# Patient Record
Sex: Female | Born: 1990 | Race: Black or African American | Hispanic: No | Marital: Single | Smoking: Never smoker
Health system: Southern US, Community
[De-identification: ages and names within clinical notes are randomized; demographics above are authoritative.]

## PROBLEM LIST (undated history)

## (undated) ENCOUNTER — Inpatient Hospital Stay (HOSPITAL_COMMUNITY): Payer: Self-pay

## (undated) DIAGNOSIS — D649 Anemia, unspecified: Secondary | ICD-10-CM

## (undated) HISTORY — PX: OTHER SURGICAL HISTORY: SHX169

---

## 2011-12-24 ENCOUNTER — Encounter (HOSPITAL_COMMUNITY): Payer: Self-pay | Admitting: *Deleted

## 2011-12-24 DIAGNOSIS — R55 Syncope and collapse: Secondary | ICD-10-CM | POA: Insufficient documentation

## 2011-12-25 ENCOUNTER — Emergency Department (HOSPITAL_COMMUNITY)
Admission: EM | Admit: 2011-12-25 | Discharge: 2011-12-25 | Discharge status: Against Medical Advice | Payer: Managed Care, Other (non HMO) | Attending: Emergency Medicine | Admitting: Emergency Medicine

## 2011-12-25 HISTORY — DX: Anemia, unspecified: D64.9

## 2011-12-25 NOTE — ED Notes (Signed)
Pt decided to leave before being screen by MD

## 2013-11-24 ENCOUNTER — Ambulatory Visit (INDEPENDENT_AMBULATORY_CARE_PROVIDER_SITE_OTHER): Payer: BC Managed Care – PPO | Admitting: Internal Medicine

## 2013-11-24 ENCOUNTER — Ambulatory Visit: Payer: BC Managed Care – PPO

## 2013-11-24 VITALS — BP 128/80 | HR 94 | Temp 98.0°F | Resp 16 | Ht 64.0 in | Wt 126.0 lb

## 2013-11-24 DIAGNOSIS — R059 Cough, unspecified: Secondary | ICD-10-CM

## 2013-11-24 DIAGNOSIS — R079 Chest pain, unspecified: Secondary | ICD-10-CM

## 2013-11-24 DIAGNOSIS — J45901 Unspecified asthma with (acute) exacerbation: Secondary | ICD-10-CM

## 2013-11-24 DIAGNOSIS — R0602 Shortness of breath: Secondary | ICD-10-CM

## 2013-11-24 DIAGNOSIS — R05 Cough: Secondary | ICD-10-CM

## 2013-11-24 LAB — POCT CBC
Granulocyte percent: 54.5 %G (ref 37–80)
HCT, POC: 47.1 % (ref 37.7–47.9)
Hemoglobin: 14.6 g/dL (ref 12.2–16.2)
Lymph, poc: 1.2 (ref 0.6–3.4)
MCH, POC: 29.8 pg (ref 27–31.2)
MCHC: 31 g/dL — AB (ref 31.8–35.4)
MCV: 96.1 fL (ref 80–97)
MID (cbc): 0.3 (ref 0–0.9)
MPV: 8.9 fL (ref 0–99.8)
POC Granulocyte: 1.9 — AB (ref 2–6.9)
POC LYMPH PERCENT: 35.6 %L (ref 10–50)
POC MID %: 9.9 %M (ref 0–12)
Platelet Count, POC: 162 10*3/uL (ref 142–424)
RBC: 4.9 M/uL (ref 4.04–5.48)
RDW, POC: 13 %
WBC: 3.5 10*3/uL — AB (ref 4.6–10.2)

## 2013-11-24 MED ORDER — ALBUTEROL SULFATE (2.5 MG/3ML) 0.083% IN NEBU: 2.5000 mg | INHALATION_SOLUTION | Freq: Once | RESPIRATORY_TRACT | Status: AC

## 2013-11-24 MED ORDER — METHYLPREDNISOLONE SODIUM SUCC 125 MG IJ SOLR
125.0000 mg | Freq: Once | INTRAMUSCULAR | Status: AC
  Administered 2013-11-24: 125 mg via INTRAMUSCULAR

## 2013-11-24 MED ORDER — PREDNISONE 20 MG PO TABS: 60.0000 mg | ORAL_TABLET | Freq: Every day | ORAL | Status: AC

## 2013-11-24 NOTE — Progress Notes (Signed)
Subjective:    Patient ID: Renee Buck, female    DOB: October 13, 1991, [age] y.o.   MRN: [id number]  HPI 23 year old female presents for evaluation of acute onset of chest pain and shortness of breath this morning.    States she woke up feeling short of breath and complains of heaviness and pressure in her chest. Was diagnosed with influenza 4 days ago at another UC. Admits symptoms are improving and her fever broke last night. She was not treated with Tamiflu.   Today complains of slight nasal congestion and cough. Admits her cough has been productive but today is dry and hacking. Denies dizziness, nausea, vomiting, sore throat, fever, or chills.    Hx of asthma treated with albuterol prn and pulmicort. Admits her asthma has been mild since middle school and has not been hospitalized since then.    Has not been using either of her inhalers since she has been sick with this illness.      Review of Systems  Constitutional: Negative for fever and chills.  HENT: Positive for congestion, postnasal drip and rhinorrhea. Negative for ear pain and sore throat.   Respiratory: Positive for cough, chest tightness and shortness of breath.   Cardiovascular: Positive for chest pain.  Gastrointestinal: Negative for nausea and vomiting.  Neurological: Negative for dizziness and headaches.       Objective:   Physical Exam  Constitutional: She is oriented to person, place, and time. She appears well-developed and well-nourished.  HENT:  Head: Normocephalic and atraumatic.  Right Ear: External ear normal.  Left Ear: External ear normal.  Mouth/Throat: Oropharynx is clear and moist.  Eyes: Conjunctivae are normal.  Neck: Normal range of motion.  Cardiovascular: Normal rate, regular rhythm and normal heart sounds.   Pulmonary/Chest: Effort normal. She has decreased breath sounds. She has no wheezes. She has no rhonchi. She has no rales.  Breath sounds increased after breathing treatment.   Neurological:  She is alert and oriented to person, place, and time.  Psychiatric: She has a normal mood and affect. Her behavior is normal. Judgment and thought content normal.     Results for orders placed in visit on 11/24/13  POCT CBC      Result Value Range   WBC 3.5 (*) 4.6 - 10.2 K/uL   Lymph, poc 1.2  0.6 - 3.4   POC LYMPH PERCENT 35.6  10 - 50 %L   MID (cbc) 0.3  0 - 0.9   POC MID % 9.9  0 - 12 %M   POC Granulocyte 1.9 (*) 2 - 6.9   Granulocyte percent 54.5  37 - 80 %G   RBC 4.90  4.04 - 5.48 M/uL   Hemoglobin 14.6  12.2 - 16.2 g/dL   HCT, POC 40.947.1  81.137.7 - 47.9 %   MCV 96.1  80 - 97 fL   MCH, POC 29.8  27 - 31.2 pg   MCHC 31.0 (*) 31.8 - 35.4 g/dL   RDW, POC 91.413.0     Platelet Count, POC 162  142 - 424 K/uL   MPV 8.9  0 - 99.8 fL     Pre nebulizer peak flow = 200 Post albuterol/atrovent nebulizer = 400 [facility] reading (PRIMARY) by  Dr. Perrin MalteseGuest as hyperinflation. No acute consolidation or infiltrate noted. . ECG normal sinus rhythm.   Assessment & Plan:  Asthma with acute exacerbation - Plan: methylPREDNISolone sodium succinate (SOLU-MEDROL) 125 mg/2 mL injection 125 mg, predniSONE (DELTASONE) 20 MG tablet  Chest pain - Plan: DG Chest 2 View, EKG 12-Lead  Cough - Plan: DG Chest 2 View, POCT CBC  Shortness of breath - Plan: albuterol (PROVENTIL) (2.5 MG/3ML) 0.083% nebulizer solution 2.5 mg, methylPREDNISolone sodium succinate (SOLU-MEDROL) 125 mg/2 mL injection 125 mg  Solumedrol 125 mg IM today Start prednisone 60 mg daily tomorrow Pulmicort 2 puffs twice daily and albuterol q4-6hours prn SOB and cough RTC precautions discussed.  Return here or go to ER with any acutely worsening symptoms.

## 2014-01-02 ENCOUNTER — Ambulatory Visit (INDEPENDENT_AMBULATORY_CARE_PROVIDER_SITE_OTHER): Payer: BC Managed Care – PPO | Admitting: Internal Medicine

## 2014-01-02 VITALS — BP 110/72 | HR 85 | Temp 98.7°F | Resp 16 | Ht 65.0 in | Wt 135.4 lb

## 2014-01-02 DIAGNOSIS — N390 Urinary tract infection, site not specified: Secondary | ICD-10-CM

## 2014-01-02 DIAGNOSIS — R109 Unspecified abdominal pain: Secondary | ICD-10-CM

## 2014-01-02 LAB — POCT UA - MICROSCOPIC ONLY
Casts, Ur, LPF, POC: NEGATIVE
Crystals, Ur, HPF, POC: NEGATIVE
Yeast, UA: NEGATIVE

## 2014-01-02 LAB — POCT URINALYSIS DIPSTICK
Bilirubin, UA: NEGATIVE
Blood, UA: NEGATIVE
Glucose, UA: NEGATIVE
KETONES UA: NEGATIVE
LEUKOCYTES UA: NEGATIVE
Nitrite, UA: NEGATIVE
PH UA: 8.5
PROTEIN UA: NEGATIVE
SPEC GRAV UA: 1.02
Urobilinogen, UA: 0.2

## 2014-01-02 MED ORDER — CIPROFLOXACIN HCL 500 MG PO TABS: 500.0000 mg | ORAL_TABLET | Freq: Two times a day (BID) | ORAL | Status: AC

## 2014-01-02 NOTE — Patient Instructions (Signed)
Urinary Tract Infection  Urinary tract infections (UTIs) can develop anywhere along your urinary tract. Your urinary tract is your body's drainage system for removing wastes and extra water. Your urinary tract includes two kidneys, two ureters, a bladder, and a urethra. Your kidneys are a pair of bean-shaped organs. Each kidney is about the size of your fist. They are located below your ribs, one on each side of your spine.  CAUSES  Infections are caused by microbes, which are microscopic organisms, including fungi, viruses, and bacteria. These organisms are so small that they can only be seen through a microscope. Bacteria are the microbes that most commonly cause UTIs.  SYMPTOMS   Symptoms of UTIs may vary by age and gender of the patient and by the location of the infection. Symptoms in young women typically include a frequent and intense urge to urinate and a painful, burning feeling in the bladder or urethra during urination. Older women and men are more likely to be tired, shaky, and weak and have muscle aches and abdominal pain. A fever may mean the infection is in your kidneys. Other symptoms of a kidney infection include pain in your back or sides below the ribs, nausea, and vomiting.  DIAGNOSIS  To diagnose a UTI, your caregiver will ask you about your symptoms. Your caregiver also will ask to provide a urine sample. The urine sample will be tested for bacteria and white blood cells. White blood cells are made by your body to help fight infection.  TREATMENT   Typically, UTIs can be treated with medication. Because most UTIs are caused by a bacterial infection, they usually can be treated with the use of antibiotics. The choice of antibiotic and length of treatment depend on your symptoms and the type of bacteria causing your infection.  HOME CARE INSTRUCTIONS   If you were prescribed antibiotics, take them exactly as your caregiver instructs you. Finish the medication even if you feel better after you  have only taken some of the medication.   Drink enough water and fluids to keep your urine clear or pale yellow.   Avoid caffeine, tea, and carbonated beverages. They tend to irritate your bladder.   Empty your bladder often. Avoid holding urine for long periods of time.   Empty your bladder before and after sexual intercourse.   After a bowel movement, women should cleanse from front to back. Use each tissue only once.  SEEK MEDICAL CARE IF:    You have back pain.   You develop a fever.   Your symptoms do not begin to resolve within 3 days.  SEEK IMMEDIATE MEDICAL CARE IF:    You have severe back pain or lower abdominal pain.   You develop chills.   You have nausea or vomiting.   You have continued burning or discomfort with urination.  MAKE SURE YOU:    Understand these instructions.   Will watch your condition.   Will get help right away if you are not doing well or get worse.  Document Released: 07/22/2005 Document Revised: 04/12/2012 Document Reviewed: 11/20/2011  [employer] Patient Information 2014 [employer]

## 2014-01-02 NOTE — Progress Notes (Signed)
   Subjective:    Patient ID: Renee Buck, female    DOB: February 21, 1991, 23 y.o.   MRN: [id number]  HPI    Review of Systems     Objective:   Physical Exam        Assessment & Plan:

## 2014-01-02 NOTE — Progress Notes (Signed)
   Subjective:    Patient ID: Renee Buck, female    DOB: July 12, 1991, 23 y.o.   MRN: [id number]  HPI 23 year old female presents with left side pain for one day. The pain is in her left lower back and also anterior on the left. Pt states she thinks she has a kidney infection. She had a kidney infection back in November and it feels similar. No burning or increased frequency with urination. She had nausea 2 days ago, but no emesis. She has not taken any OTC medicines. Last time got much worse fast with fever, nausea and vomiting.   Review of Systems     Objective:   Physical Exam  Vitals reviewed. Constitutional: She is oriented to person, place, and time. She appears well-developed and well-nourished. No distress.  HENT:  Head: Normocephalic.  Eyes: EOM are normal. No scleral icterus.  Neck: Normal range of motion.  Pulmonary/Chest: Effort normal.  Abdominal: Soft. She exhibits no mass. There is no hepatosplenomegaly. There is tenderness in the left upper quadrant. There is CVA tenderness. There is no guarding.  Neurological: She is alert and oriented to person, place, and time. She exhibits normal muscle tone. Coordination normal.  Psychiatric: She has a normal mood and affect.    Results for orders placed in visit on 01/02/14  POCT URINALYSIS DIPSTICK      Result Value Ref Range   Color, UA yellow     Clarity, UA clear     Glucose, UA neg     Bilirubin, UA neg     Ketones, UA neg     Spec Grav, UA 1.020     Blood, UA neg     pH, UA 8.5     Protein, UA neg     Urobilinogen, UA 0.2     Nitrite, UA neg     Leukocytes, UA Negative     Results for orders placed in visit on 01/02/14  POCT URINALYSIS DIPSTICK      Result Value Ref Range   Color, UA yellow     Clarity, UA clear     Glucose, UA neg     Bilirubin, UA neg     Ketones, UA neg     Spec Grav, UA 1.020     Blood, UA neg     pH, UA 8.5     Protein, UA neg     Urobilinogen, UA 0.2     Nitrite, UA neg     Leukocytes, UA Negative    POCT UA - MICROSCOPIC ONLY      Result Value Ref Range   WBC, Ur, HPF, POC 0*-1     RBC, urine, microscopic 0-3     Bacteria, U Microscopic 1+     Mucus, UA trace     Epithelial cells, urine per micros 1-4     Crystals, Ur, HPF, POC neg     Casts, Ur, LPF, POC neg     Yeast, UA neg       Cs pending     Assessment & Plan:  Flank pain cause unclear/Hydrate, probable early uti Cipro 500mg  10d

## 2014-01-04 LAB — URINE CULTURE: Colony Count: 50000

## 2014-01-05 ENCOUNTER — Other Ambulatory Visit: Payer: Self-pay | Admitting: *Deleted

## 2014-01-05 ENCOUNTER — Encounter: Payer: Self-pay | Admitting: *Deleted

## 2014-01-05 MED ORDER — AMOXICILLIN 500 MG PO CAPS: 1000.0000 mg | ORAL_CAPSULE | Freq: Two times a day (BID) | ORAL | Status: AC

## 2014-02-07 ENCOUNTER — Ambulatory Visit: Payer: Managed Care, Other (non HMO) | Admitting: Physician Assistant

## 2015-07-04 IMAGING — CR DG CHEST 2V
2 series · 2 of 2 positions shown · non-contrast
Comparison: None.

CLINICAL DATA: Chest pain

EXAM:
CHEST  2 VIEW

[PA]
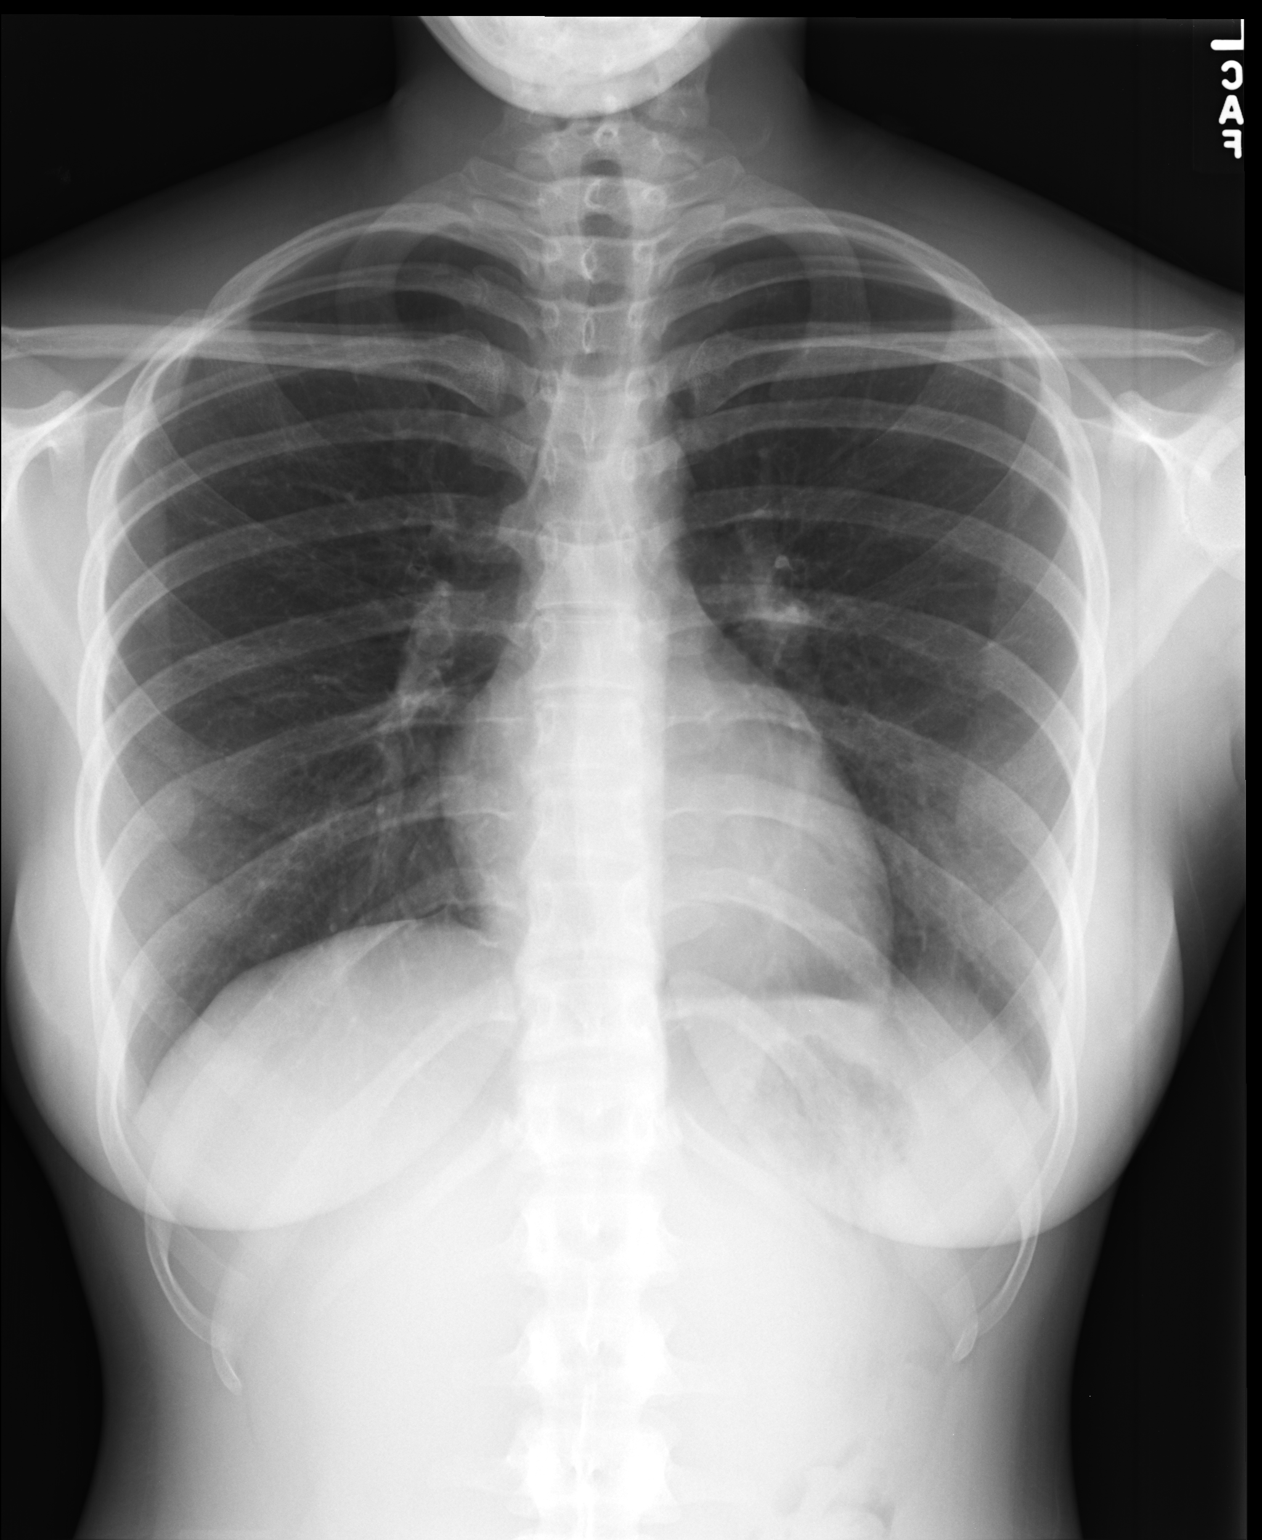

[lateral]
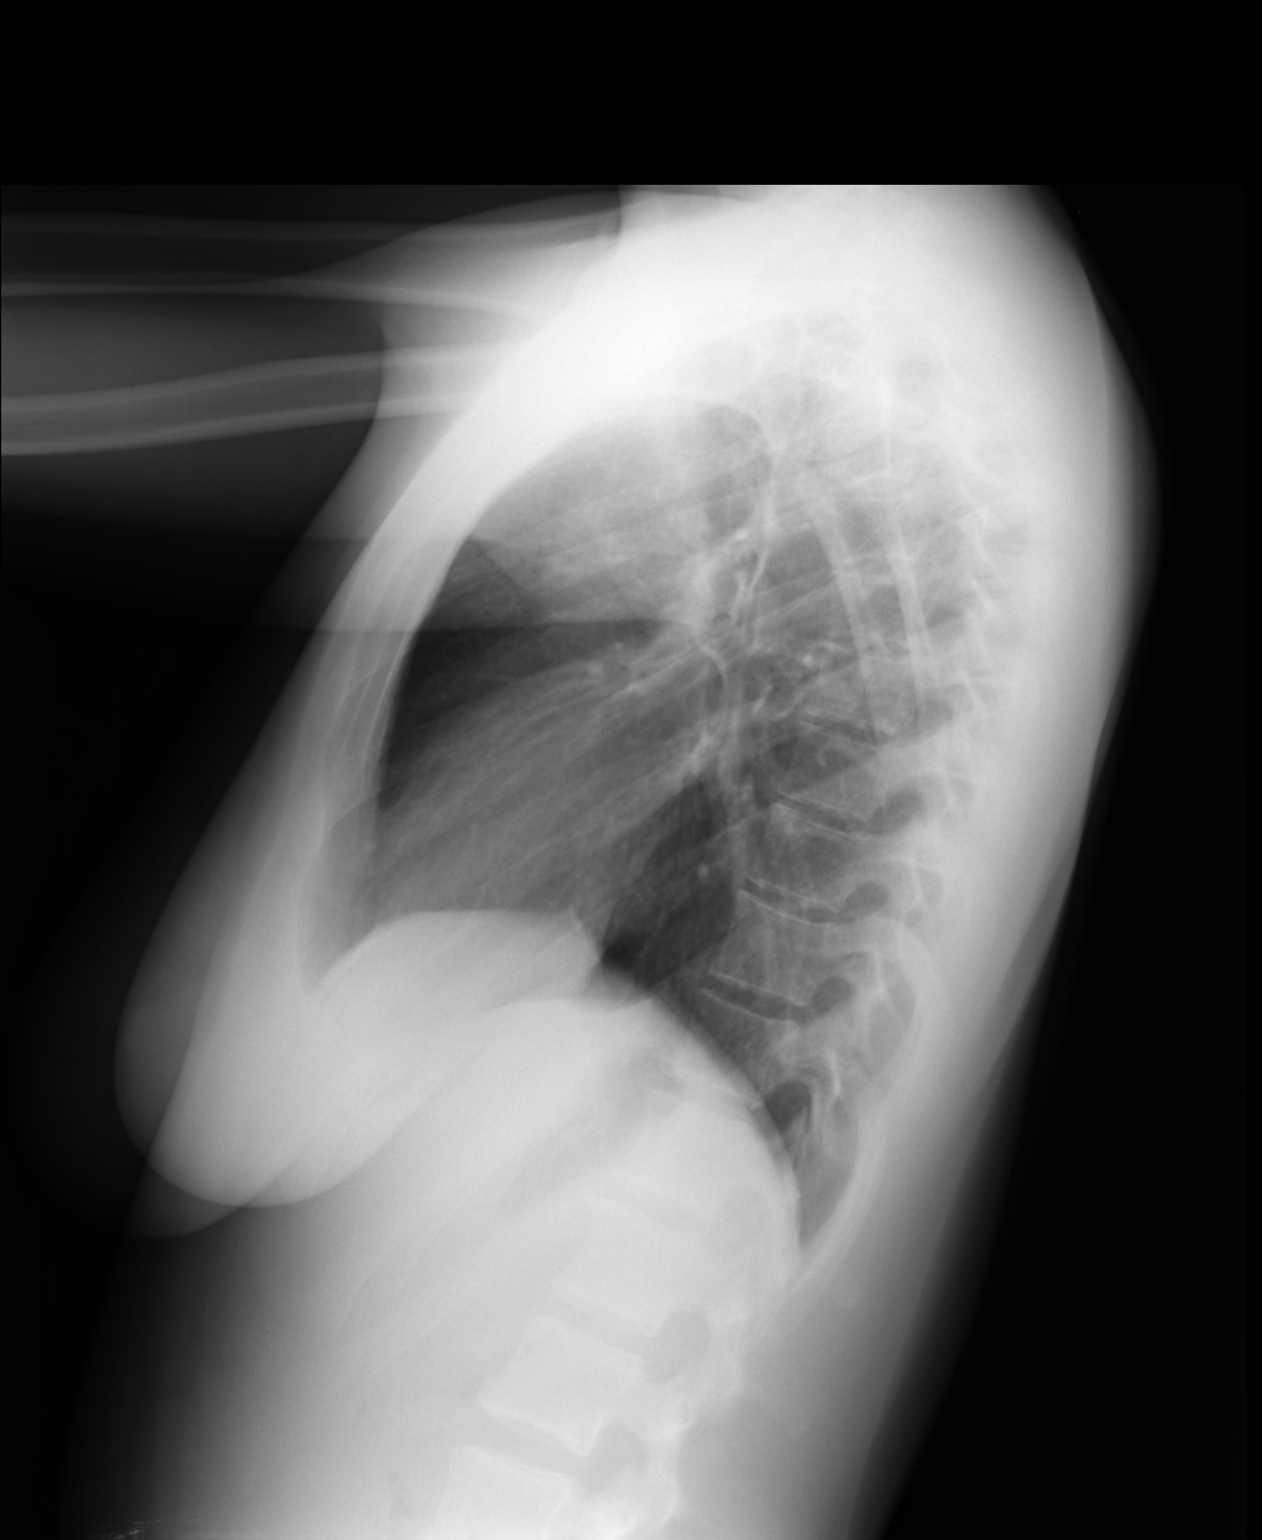

[2 of 2 positions shown; findings below may reference images not displayed]

FINDINGS: The heart size and mediastinal contours are within normal limits.
Both lungs are clear. The visualized skeletal structures are
unremarkable.
IMPRESSION: No active cardiopulmonary disease.

## 2015-11-07 ENCOUNTER — Inpatient Hospital Stay (HOSPITAL_COMMUNITY): Payer: 59

## 2015-11-07 ENCOUNTER — Encounter (HOSPITAL_COMMUNITY): Payer: Self-pay | Admitting: *Deleted

## 2015-11-07 ENCOUNTER — Inpatient Hospital Stay (HOSPITAL_COMMUNITY)
Admission: AD | Admit: 2015-11-07 | Discharge: 2015-11-08 | Disposition: A | Discharge status: Home / Self Care | Payer: 59 | Source: Ambulatory Visit | Attending: Obstetrics & Gynecology | Admitting: Obstetrics & Gynecology

## 2015-11-07 DIAGNOSIS — O3680X0 Pregnancy with inconclusive fetal viability, not applicable or unspecified: Secondary | ICD-10-CM

## 2015-11-07 DIAGNOSIS — O209 Hemorrhage in early pregnancy, unspecified: Secondary | ICD-10-CM

## 2015-11-07 DIAGNOSIS — Z3A11 11 weeks gestation of pregnancy: Secondary | ICD-10-CM | POA: Diagnosis not present

## 2015-11-07 DIAGNOSIS — O200 Threatened abortion: Secondary | ICD-10-CM

## 2015-11-07 LAB — CBC
HEMATOCRIT: 38.3 % (ref 36.0–46.0)
HEMOGLOBIN: 12.9 g/dL (ref 12.0–15.0)
MCH: 29.8 pg (ref 26.0–34.0)
MCHC: 33.7 g/dL (ref 30.0–36.0)
MCV: 88.5 fL (ref 78.0–100.0)
Platelets: 202 10*3/uL (ref 150–400)
RBC: 4.33 MIL/uL (ref 3.87–5.11)
RDW: 12.8 % (ref 11.5–15.5)
WBC: 8.8 10*3/uL (ref 4.0–10.5)

## 2015-11-07 LAB — URINALYSIS, ROUTINE W REFLEX MICROSCOPIC
Bilirubin Urine: NEGATIVE
GLUCOSE, UA: NEGATIVE mg/dL
Ketones, ur: 15 mg/dL — AB
LEUKOCYTES UA: NEGATIVE
Nitrite: NEGATIVE
PH: 6 (ref 5.0–8.0)
PROTEIN: NEGATIVE mg/dL
Specific Gravity, Urine: >1.03 — ABNORMAL HIGH (ref 1.005–1.030)

## 2015-11-07 LAB — URINE MICROSCOPIC-ADD ON

## 2015-11-07 LAB — WET PREP, GENITAL
CLUE CELLS WET PREP: NONE SEEN
SPERM: NONE SEEN
Trich, Wet Prep: NONE SEEN
YEAST WET PREP: NONE SEEN

## 2015-11-07 NOTE — MAU Note (Signed)
Notified provider that patient came in with c/o vaginal bleeding that started tonight at 2100. She had some spotting Wednesday. She was seen in the [facility] on 11/28  For pregnancy verification but has not been seen there since. She does not plan to be seen there again for prenatal care. Provider said that faculty practice could see patient.

## 2015-11-07 NOTE — MAU Provider Note (Signed)
History     CSN: [id number]  Arrival date and time: 11/07/15 2150   First Provider Initiated Contact with Patient 11/07/15 2235      Chief Complaint  Patient presents with  . Abdominal Cramping  . Vaginal Bleeding   HPI Comments: Renee Buck is a 25 y.o. G1P0 at [redacted]w[redacted]d who presents today with vaginal bleeding. She states that she was seen at [facility] on 09/23/[facility],and had a BHCG level drawn. She did not have any follow up from there. She states that she did not have pain or bleeding until about 5 days ago. She states that it has been brown, but is now red.   Vaginal Bleeding The patient's primary symptoms include pelvic pain. This is a new problem. The current episode started in the past 7 days. The problem occurs intermittently. The problem has been gradually worsening. The pain is mild (1/10). The problem affects both sides. She is pregnant. The vaginal bleeding is typical of menses. She has been passing clots (one clot about 1 inch long. ). She has not been passing tissue. Nothing aggravates the symptoms. She has tried nothing for the symptoms. The treatment provided no relief. Menstrual history: LMP 08/15/15     Past Medical History  Diagnosis Date  . Asthma     exercised induced  . Anemia     Past Surgical History  Procedure Laterality Date  . Wisdom tooth       History reviewed. No pertinent family history.  Social History  Substance Use Topics  . Smoking status: Never Smoker   . Smokeless tobacco: None  . Alcohol Use: No    Allergies: No Known Allergies  Facility-administered medications prior to admission  Medication Dose Route Frequency Provider Last Rate Last Dose  . albuterol (PROVENTIL) (2.5 MG/3ML) 0.083% nebulizer solution 2.5 mg  2.5 mg Nebulization Once Nelva Nay, PA-C       Prescriptions prior to admission  Medication Sig Dispense Refill Last Dose  . amoxicillin (AMOXIL) 500 MG capsule Take 2 capsules (1,000 mg total) by mouth 2 (two)  times daily. 20 capsule 0   . ciprofloxacin (CIPRO) 500 MG tablet Take 1 tablet (500 mg total) by mouth 2 (two) times daily. 20 tablet 0   . norethindrone-ethinyl estradiol (JUNEL FE,GILDESS FE,LOESTRIN FE) 1-20 MG-MCG tablet Take 1 tablet by mouth daily.   Taking  . predniSONE (DELTASONE) 20 MG tablet Take 3 tablets (60 mg total) by mouth daily with breakfast. 15 tablet 0 Not Taking    Review of Systems  Genitourinary: Positive for vaginal bleeding and pelvic pain.   Physical Exam   Blood pressure 131/73, pulse 81, temperature 98.1 F (36.7 C), temperature source Oral, resp. rate 20, height 5\' 5"  (1.651 m), weight 69.06 kg (152 lb 4 oz), last menstrual period 08/15/2015.  Physical Exam  Nursing note and vitals reviewed. Constitutional: She is oriented to person, place, and time. She appears well-developed and well-nourished. No distress.  HENT:  Head: Normocephalic.  Cardiovascular: Normal rate.   Respiratory: Effort normal.  GI: Soft. There is no tenderness. There is no rebound.  Genitourinary:   External: no lesion Vagina: small amount of blood seen  Cervix: pink, smooth, no CMT Uterus: NSSC  Adnexa: NT   Neurological: She is alert and oriented to person, place, and time.  Skin: Skin is warm and dry.  Psychiatric: She has a normal mood and affect.   Results for orders placed or performed during the hospital encounter of 11/07/15 (from  the past 24 hour(s))  Urinalysis, Routine w reflex microscopic ([facility])     Status: Abnormal   Collection Time: 11/07/15 10:07 PM  Result Value Ref Range   Color, Urine YELLOW YELLOW   APPearance CLEAR CLEAR   Specific Gravity, Urine >1.030 (H) 1.005 - 1.030   pH 6.0 5.0 - 8.0   Glucose, UA NEGATIVE NEGATIVE mg/dL   Hgb urine dipstick LARGE (A) NEGATIVE   Bilirubin Urine NEGATIVE NEGATIVE   Ketones, ur 15 (A) NEGATIVE mg/dL   Protein, ur NEGATIVE NEGATIVE mg/dL   Nitrite NEGATIVE NEGATIVE   Leukocytes, UA NEGATIVE NEGATIVE  Urine  microscopic-add on     Status: Abnormal   Collection Time: 11/07/15 10:07 PM  Result Value Ref Range   Squamous Epithelial / LPF 0-5 (A) NONE SEEN   WBC, UA 0-5 0 - 5 WBC/hpf   RBC / HPF 6-30 0 - 5 RBC/hpf   Bacteria, UA RARE (A) NONE SEEN  Wet prep, genital     Status: Abnormal   Collection Time: 11/07/15 10:53 PM  Result Value Ref Range   Yeast Wet Prep HPF POC NONE SEEN NONE SEEN   Trich, Wet Prep NONE SEEN NONE SEEN   Clue Cells Wet Prep HPF POC NONE SEEN NONE SEEN   WBC, Wet Prep HPF POC FEW (A) NONE SEEN   Sperm NONE SEEN   CBC     Status: None   Collection Time: 11/07/15 11:05 PM  Result Value Ref Range   WBC 8.8 4.0 - 10.5 K/uL   RBC 4.33 3.87 - 5.11 MIL/uL   Hemoglobin 12.9 12.0 - 15.0 g/dL   HCT 16.138.3 09.636.0 - 04.546.0 %   MCV 88.5 78.0 - 100.0 fL   MCH 29.8 26.0 - 34.0 pg   MCHC 33.7 30.0 - 36.0 g/dL   RDW 40.912.8 81.111.5 - 91.415.5 %   Platelets 202 150 - 400 K/uL  ABO/Rh     Status: None (Preliminary result)   Collection Time: 11/07/15 11:05 PM  Result Value Ref Range   ABO/RH(D) A POS   hCG, quantitative, pregnancy     Status: Abnormal   Collection Time: 11/07/15 11:05 PM  Result Value Ref Range   hCG, Beta Chain, Quant, S 9956 (H) <5 mIU/mL   [place] Ob Comp Less 14 Wks  11/08/2015  CLINICAL DATA:  Vaginal bleeding for 1.5 hours.  Pelvic cramping. EXAM: OBSTETRIC <14 WK ULTRASOUND TECHNIQUE: Transabdominal ultrasound was performed for evaluation of the gestation as well as the maternal uterus and adnexal regions. Patient declined transvaginal evaluation. COMPARISON:  None. FINDINGS: Intrauterine gestational sac: Probable intrauterine gestational sac. Shape is slightly irregular. Yolk sac:  Not present. Embryo:  Not present. MSD: 15.1  mm   6 w   2  d                US EDC: 06/30/2016 Subchorionic hemorrhage: None visualized on transabdominal evaluation. Maternal uterus/adnexae: Both ovaries are identified and are normal. No pelvic free fluid is seen. IMPRESSION: Probable early  intrauterine gestational sac, but no yolk sac, fetal pole, or cardiac activity yet visualized. Recommend follow-up quantitative B-HCG levels and follow-up US in 14 days to confirm and assess viability. This recommendation follows SRU consensus guidelines: Diagnostic Criteria for Nonviable Pregnancy Early in the First Trimester. Malva Limes Engl J Med 2013; 782:9562-13; 369:1443-51. Please no patient declined transvaginal evaluation, partially limiting assessment. Electronically Signed   By: Rubye OaksMelanie  Ehinger M.D.   On: 11/08/2015 00:08    MAU  Course  Procedures  MDM   Assessment and Plan   1. Threatened abortion in early pregnancy   2. Vaginal bleeding in pregnancy, first trimester   3. Pregnancy of unknown anatomic location    DC home Comfort measures reviewed  1st Trimester precautions  Bleeding precautions Ectopic precautions RX: none  Return to MAU as needed FU with OB as planned  Follow-up Information    Follow up with [facility] [place] [facility].   Why:  Saturday night or Sunday morning for blood work.    Contact information:   [address] [id number] mc [place] [address] [phone]        Tawnya Crook 11/08/2015, 12:10 AM

## 2015-11-07 NOTE — MAU Note (Signed)
PT  SAYS SHE HAS VAG  BLEEDING  THAT  STARTED    AT 9PM   WHILE  SITTING.       HAD   SPOTTING  ON   WED-  AND  HAD  PAD  ON -   HAD  A  GUSH  RED  BLOOD.            PAD ON IN TRIAGE-        SMALL AMT  LIGHT  RED.      STARTED  HAVING    CRAMPS  AT  9PM.        PREG  WAS  CONFIRMED  AT  [facility]  11-28.Marland Kitchen.    WANTS PNC IN [place]- .         LAST SEX-    WED.

## 2015-11-08 DIAGNOSIS — O200 Threatened abortion: Secondary | ICD-10-CM

## 2015-11-08 LAB — ABO/RH: ABO/RH(D): A POS

## 2015-11-08 LAB — HCG, QUANTITATIVE, PREGNANCY: hCG, Beta Chain, Quant, S: 9956 m[IU]/mL — ABNORMAL HIGH (ref <5)

## 2015-11-08 LAB — HIV ANTIBODY (ROUTINE TESTING W REFLEX): HIV SCREEN 4TH GENERATION: NONREACTIVE

## 2015-11-08 LAB — GC/CHLAMYDIA PROBE AMP (~~LOC~~) NOT AT ARMC
CHLAMYDIA, DNA PROBE: NEGATIVE
Neisseria Gonorrhea: NEGATIVE

## 2015-11-08 LAB — RPR: RPR: NONREACTIVE

## 2015-11-08 NOTE — Discharge Instructions (Signed)
Threatened Miscarriage °A threatened miscarriage occurs when you have vaginal bleeding during your first 20 weeks of pregnancy but the pregnancy has not ended. If you have vaginal bleeding during this time, your health care provider will do tests to make sure you are still pregnant. If the tests show you are still pregnant and the developing baby (fetus) inside your womb (uterus) is still growing, your condition is considered a threatened miscarriage. °A threatened miscarriage does not mean your pregnancy will end, but it does increase the risk of losing your pregnancy (complete miscarriage). °CAUSES  °The cause of a threatened miscarriage is usually not known. If you go on to have a complete miscarriage, the most common cause is an abnormal number of chromosomes in the developing baby. Chromosomes are the structures inside cells that hold all your genetic material. °Some causes of vaginal bleeding that do not result in miscarriage include: °· Having sex. °· Having an infection. °· Normal hormone changes of pregnancy. °· Bleeding that occurs when an egg implants in your uterus. °RISK FACTORS °Risk factors for bleeding in early pregnancy include: °· Obesity. °· Smoking. °· Drinking excessive amounts of alcohol or caffeine. °· Recreational drug use. °SIGNS AND SYMPTOMS °· Light vaginal bleeding. °· Mild abdominal pain or cramps. °DIAGNOSIS  °If you have bleeding with or without abdominal pain before 20 weeks of pregnancy, your health care provider will do tests to check whether you are still pregnant. One important test involves using sound waves and a computer (ultrasound) to create images of the inside of your uterus. Other tests include an internal exam of your vagina and uterus (pelvic exam) and measurement of your baby's heart rate.  °You may be diagnosed with a threatened miscarriage if: °· Ultrasound testing shows you are still pregnant. °· Your baby's heart rate is strong. °· A pelvic exam shows that the  opening between your uterus and your vagina (cervix) is closed. °· Your heart rate and blood pressure are stable. °· Blood tests confirm you are still pregnant. °TREATMENT  °No treatments have been shown to prevent a threatened miscarriage from going on to a complete miscarriage. However, the right home care is important.  °HOME CARE INSTRUCTIONS  °· Make sure you keep all your appointments for prenatal care. This is very important. °· Get plenty of rest. °· Do not have sex or use tampons if you have vaginal bleeding. °· Do not douche. °· Do not smoke or use recreational drugs. °· Do not drink alcohol. °· Avoid caffeine. °SEEK MEDICAL CARE IF: °· You have light vaginal bleeding or spotting while pregnant. °· You have abdominal pain or cramping. °· You have a fever. °SEEK IMMEDIATE MEDICAL CARE IF: °· You have heavy vaginal bleeding. °· You have blood clots coming from your vagina. °· You have severe low back pain or abdominal cramps. °· You have fever, chills, and severe abdominal pain. °MAKE SURE YOU: °· Understand these instructions. °· Will watch your condition. °· Will get help right away if you are not doing well or get worse. °  °This information is not intended to replace advice given to you by your health care provider. Make sure you discuss any questions you have with your health care provider. °  °Document Released: 10/12/2005 Document Revised: 10/17/2013 Document Reviewed: 08/08/2013 °Elsevier Interactive Patient Education ©2016 [employer] °Ectopic Pregnancy °An ectopic pregnancy is when the fertilized egg attaches (implants) outside the uterus. Most ectopic pregnancies occur in the fallopian tube. Rarely do ectopic pregnancies occur   on the ovary, intestine, pelvis, or cervix. In an ectopic pregnancy, the fertilized egg does not have the ability to develop into a normal, healthy baby.  °A ruptured ectopic pregnancy is one in which the fallopian tube gets torn or bursts and results in internal bleeding.  Often there is intense abdominal pain, and sometimes, vaginal bleeding. Having an ectopic pregnancy can be life threatening. If left untreated, this dangerous condition can lead to a blood transfusion, abdominal surgery, or even death. °CAUSES  °Damage to the fallopian tubes is the suspected cause in most ectopic pregnancies.  °RISK FACTORS °Depending on your circumstances, the risk of having an ectopic pregnancy will vary. The level of risk can be divided into three categories. °High Risk °· You have gone through infertility treatment. °· You have had a previous ectopic pregnancy. °· You have had previous tubal surgery. °· You have had previous surgery to have the fallopian tubes tied (tubal ligation). °· You have tubal problems or diseases. °· You have been exposed to DES. DES is a medicine that was used until 1971 and had effects on babies whose mothers took the medicine. °· You become pregnant while using an intrauterine device (IUD) for birth control.  °Moderate Risk °· You have a history of infertility. °· You have a history of a sexually transmitted infection (STI). °· You have a history of pelvic inflammatory disease (PID). °· You have scarring from endometriosis. °· You have multiple sexual partners. °· You smoke.  °Low Risk °· You have had previous pelvic surgery. °· You use vaginal douching. °· You became sexually active before 24 years of age. °SIGNS AND SYMPTOMS  °An ectopic pregnancy should be suspected in anyone who has missed a period and has abdominal pain or bleeding. °· You may experience normal pregnancy symptoms, such as: °· Nausea. °· Tiredness. °· Breast tenderness. °· Other symptoms may include: °· Pain with intercourse. °· Irregular vaginal bleeding or spotting. °· Cramping or pain on one side or in the lower abdomen. °· Fast heartbeat. °· Passing out while having a bowel movement. °· Symptoms of a ruptured ectopic pregnancy and internal bleeding may include: °· Sudden, severe pain in the  abdomen and pelvis. °· Dizziness or fainting. °· Pain in the shoulder area. °DIAGNOSIS  °Tests that may be performed include: °· A pregnancy test. °· An ultrasound test. °· Testing the specific level of pregnancy hormone in the bloodstream. °· Taking a sample of uterus tissue (dilation and curettage, D&C). °· Surgery to perform a visual exam of the inside of the abdomen using a thin, lighted tube with a tiny camera on the end (laparoscope). °TREATMENT  °An injection of a medicine called methotrexate may be given. This medicine causes the pregnancy tissue to be absorbed. It is given if: °· The diagnosis is made early. °· The fallopian tube has not ruptured. °· You are considered to be a good candidate for the medicine. °Usually, pregnancy hormone blood levels are checked after methotrexate treatment. This is to be sure the medicine is effective. It may take 4-6 weeks for the pregnancy to be absorbed (though most pregnancies will be absorbed by 3 weeks). °Surgical treatment may be needed. A laparoscope may be used to remove the pregnancy tissue. If severe internal bleeding occurs, a cut (incision) may be made in the lower abdomen (laparotomy), and the ectopic pregnancy is removed. This stops the bleeding. Part of the fallopian tube, or the whole tube, may be removed as well (salpingectomy). After surgery, pregnancy hormone tests   may be done to be sure there is no pregnancy tissue left. You may receive a Rho (D) immune globulin shot if you are Rh negative and the father is Rh positive, or if you do not know the Rh type of the father. This is to prevent problems with any future pregnancy. °SEEK IMMEDIATE MEDICAL CARE IF:  °You have any symptoms of an ectopic pregnancy. This is a medical emergency. °MAKE SURE YOU: °· Understand these instructions. °· Will watch your condition. °· Will get help right away if you are not doing well or get worse. °  °This information is not intended to replace advice given to you by your  health care provider. Make sure you discuss any questions you have with your health care provider. °  °Document Released: 11/19/2004 Document Revised: 11/02/2014 Document Reviewed: 05/11/2013 °Elsevier Interactive Patient Education ©2016 [employer] ° °

## 2015-11-10 ENCOUNTER — Inpatient Hospital Stay (HOSPITAL_COMMUNITY)
Admission: AD | Admit: 2015-11-10 | Discharge: 2015-11-10 | Disposition: A | Discharge status: Home / Self Care | Payer: 59 | Source: Ambulatory Visit | Attending: Obstetrics & Gynecology | Admitting: Obstetrics & Gynecology

## 2015-11-10 DIAGNOSIS — Z3A12 12 weeks gestation of pregnancy: Secondary | ICD-10-CM | POA: Insufficient documentation

## 2015-11-10 DIAGNOSIS — O209 Hemorrhage in early pregnancy, unspecified: Secondary | ICD-10-CM | POA: Diagnosis present

## 2015-11-10 DIAGNOSIS — O039 Complete or unspecified spontaneous abortion without complication: Secondary | ICD-10-CM | POA: Insufficient documentation

## 2015-11-10 LAB — HCG, QUANTITATIVE, PREGNANCY: HCG, BETA CHAIN, QUANT, S: 1188 m[IU]/mL — AB (ref <5)

## 2015-11-10 NOTE — MAU Provider Note (Signed)
S: 25 y.o. G1P0 @[redacted]w[redacted]d  by LMP presents to MAU for repeat hcg.  She presented 1/12 for vaginal bleeding and abdominal pain.  She reports vaginal bleeding like menses continues today.  She is changing a pad every 3-4 hours and reports small clots with bleeding.  She reports mild cramping associated with the bleeding. She has not needed anything, including medications, for her pain.  She denies dizziness, h/a, or n/v.     Her quant hcg on 1/12 was 9956 and ultrasound showed gestational sac, no yolk sac or fetal pole.  HPI  O: BP 131/67 mmHg  Pulse 93  Temp(Src) 98.7 F (37.1 C) (Oral)  Resp 16  LMP 08/15/2015  VS reviewed, nursing note reviewed,  Constitutional: well developed, well nourished, no distress HEENT: normocephalic CV: normal rate Pulm/chest wall: normal effort Abdomen: soft Neuro: alert and oriented x 3 Skin: warm, dry Psych: affect normal  Results for orders placed or performed during the hospital encounter of 11/10/15 (from the past 24 hour(s))  hCG, quantitative, pregnancy     Status: Abnormal   Collection Time: 11/10/15 10:44 AM  Result Value Ref Range   hCG, Beta Chain, Quant, S 1188 (H) <5 mIU/mL    --/--/A POS (01/12 2305)  MDM: Ordered labs/reviewed results.  Quant hcg dropped significantly.  Discussed results with pt.  Pt to f/u in [facility] in 1 week.  Bleeding precautions given. Pt stable at time of discharge.  A: 1. SAB (spontaneous abortion)     P: D/C home with bleeding precautions F/U in [facility] in 1 week, message sent to set up appointment Return to MAU as needed for emergencies  LEFTWICH-KIRBY, Tammera Engert, CNM 2:18 PM

## 2015-11-10 NOTE — MAU Note (Signed)
Patient presents with still having vaginal bleeding changing  Pad every 2 hours or so, cramping, 4/10.

## 2015-11-10 NOTE — Discharge Instructions (Signed)
Miscarriage  A miscarriage is the sudden loss of an unborn baby (fetus) before the 20th week of pregnancy. Most miscarriages happen in the first 3 months of pregnancy. Sometimes, it happens before a woman even knows she is pregnant. A miscarriage is also called a "spontaneous miscarriage" or "early pregnancy loss." Having a miscarriage can be an emotional experience. Talk with your caregiver about any questions you may have about miscarrying, the grieving process, and your future pregnancy plans.  CAUSES    Problems with the fetal chromosomes that make it impossible for the baby to develop normally. Problems with the baby's genes or chromosomes are most often the result of errors that occur, by chance, as the embryo divides and grows. The problems are not inherited from the parents.   Infection of the cervix or uterus.    Hormone problems.    Problems with the cervix, such as having an incompetent cervix. This is when the tissue in the cervix is not strong enough to hold the pregnancy.    Problems with the uterus, such as an abnormally shaped uterus, uterine fibroids, or congenital abnormalities.    Certain medical conditions.    Smoking, drinking alcohol, or taking illegal drugs.    Trauma.   Often, the cause of a miscarriage is unknown.   SYMPTOMS    Vaginal bleeding or spotting, with or without cramps or pain.   Pain or cramping in the abdomen or lower back.   Passing fluid, tissue, or blood clots from the vagina.  DIAGNOSIS   Your caregiver will perform a physical exam. You may also have an ultrasound to confirm the miscarriage. Blood or urine tests may also be ordered.  TREATMENT    Sometimes, treatment is not necessary if you naturally pass all the fetal tissue that was in the uterus. If some of the fetus or placenta remains in the body (incomplete miscarriage), tissue left behind may become infected and must be removed. Usually, a dilation and curettage (D and C) procedure is performed.  During a D and C procedure, the cervix is widened (dilated) and any remaining fetal or placental tissue is gently removed from the uterus.   Antibiotic medicines are prescribed if there is an infection. Other medicines may be given to reduce the size of the uterus (contract) if there is a lot of bleeding.   If you have Rh negative blood and your baby was Rh positive, you will need a Rh immunoglobulin shot. This shot will protect any future baby from having Rh blood problems in future pregnancies.  HOME CARE INSTRUCTIONS    Your caregiver may order bed rest or may allow you to continue light activity. Resume activity as directed by your caregiver.   Have someone help with home and family responsibilities during this time.    Keep track of the number of sanitary pads you use each day and how soaked (saturated) they are. Write down this information.    Do not use tampons. Do not douche or have sexual intercourse until approved by your caregiver.    Only take over-the-counter or prescription medicines for pain or discomfort as directed by your caregiver.    Do not take aspirin. Aspirin can cause bleeding.    Keep all follow-up appointments with your caregiver.    If you or your partner have problems with grieving, talk to your caregiver or seek counseling to help cope with the pregnancy loss. Allow enough time to grieve before trying to get pregnant again.     SEEK IMMEDIATE MEDICAL CARE IF:    You have severe cramps or pain in your back or abdomen.   You have a fever.   You pass large blood clots (walnut-sized or larger) ortissue from your vagina. Save any tissue for your caregiver to inspect.    Your bleeding increases.    You have a thick, bad-smelling vaginal discharge.   You become lightheaded, weak, or you faint.    You have chills.   MAKE SURE YOU:   Understand these instructions.   Will watch your condition.   Will get help right away if you are not doing well or get worse.     This  information is not intended to replace advice given to you by your health care provider. Make sure you discuss any questions you have with your health care provider.     Document Released: 04/07/2001 Document Revised: 02/06/2013 Document Reviewed: 12/01/2011  Elsevier Interactive Patient Education 2016 [employer] Inc.

## 2015-11-25 ENCOUNTER — Ambulatory Visit: Payer: 59 | Admitting: Obstetrics and Gynecology

## 2015-11-28 ENCOUNTER — Ambulatory Visit: Payer: 59 | Admitting: Obstetrics and Gynecology

## 2016-09-11 ENCOUNTER — Encounter (HOSPITAL_COMMUNITY): Payer: Self-pay

## 2017-06-16 IMAGING — US US OB COMP LESS 14 WK
1 series · 15 of 27 positions shown · non-contrast
Comparison: None.

CLINICAL DATA: Vaginal bleeding for 1.5 hours.  Pelvic cramping.

EXAM:
OBSTETRIC <14 WK ULTRASOUND
TECHNIQUE: Transabdominal ultrasound was performed for evaluation of the
gestation as well as the maternal uterus and adnexal regions.
Patient declined transvaginal evaluation.

[Series 1: us ob comp less 14 wk · 27 acquisitions, 15 frames shown]
[im 1/27]
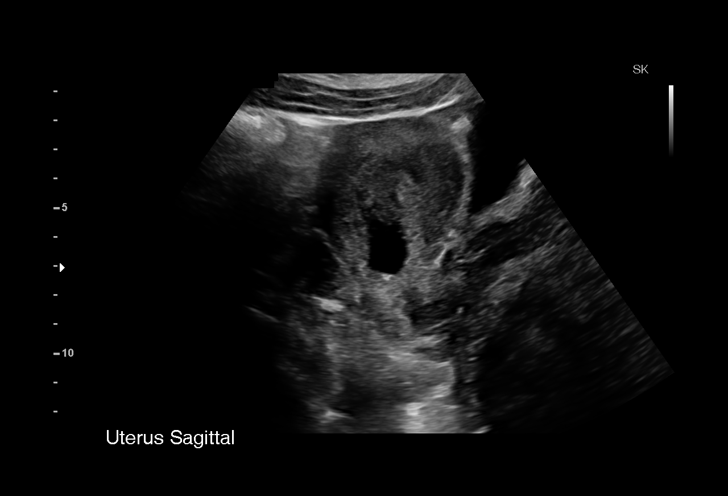
[im 3/27]
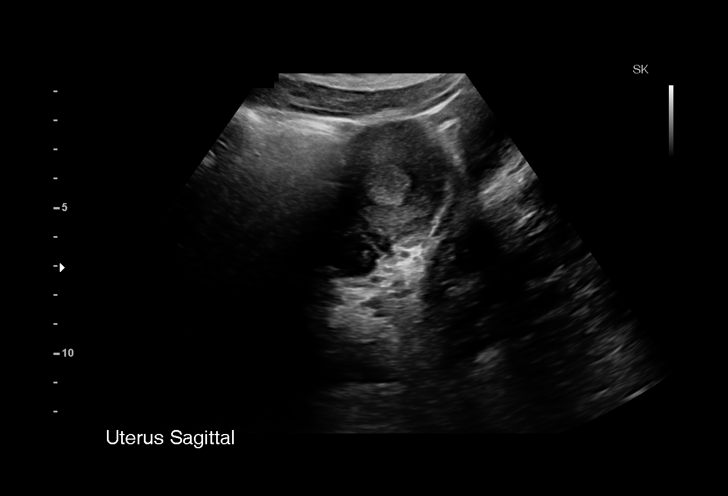
[im 5/27]
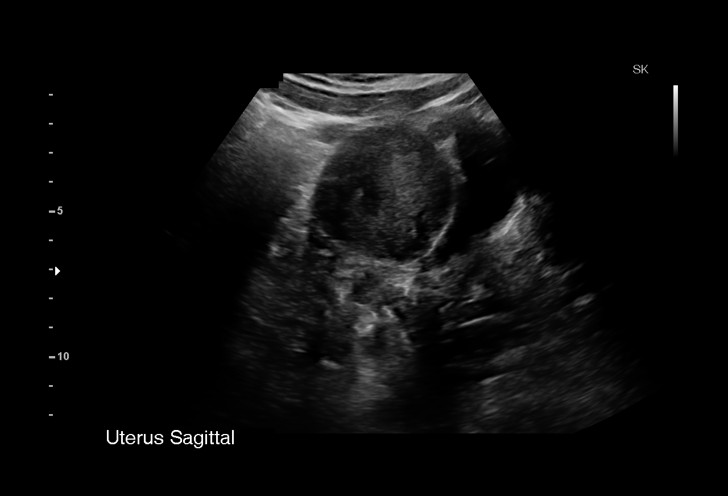
[im 7/27]
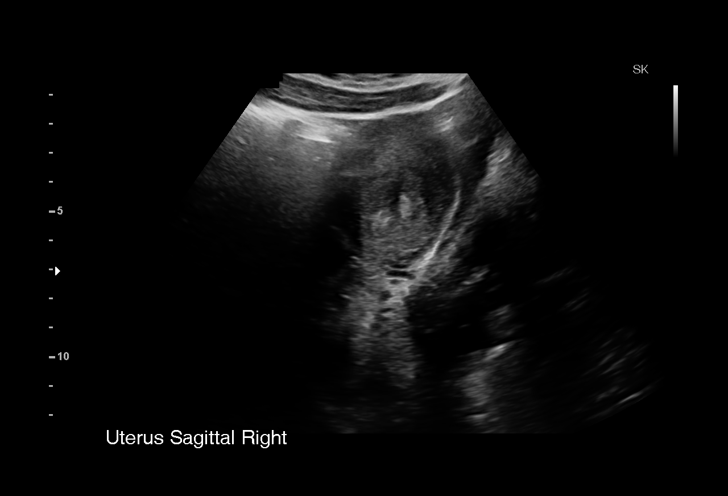
[im 9/27]
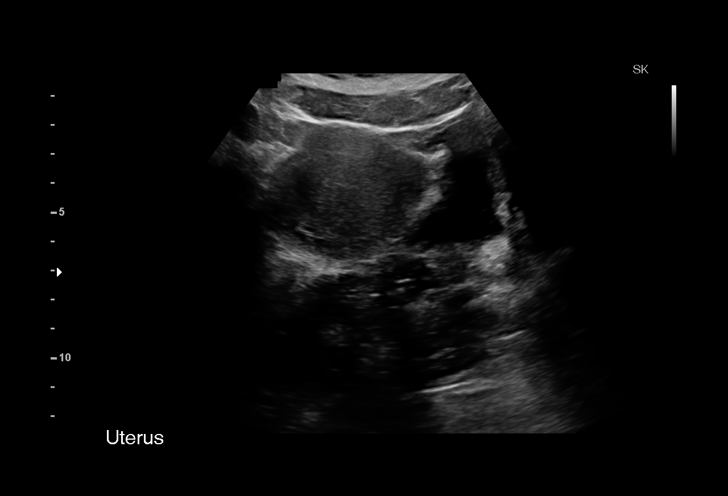
[im 10/27]
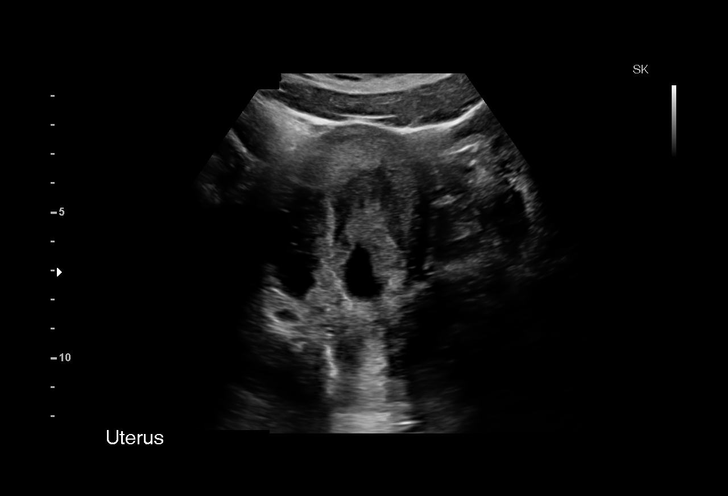
[im 12/27]
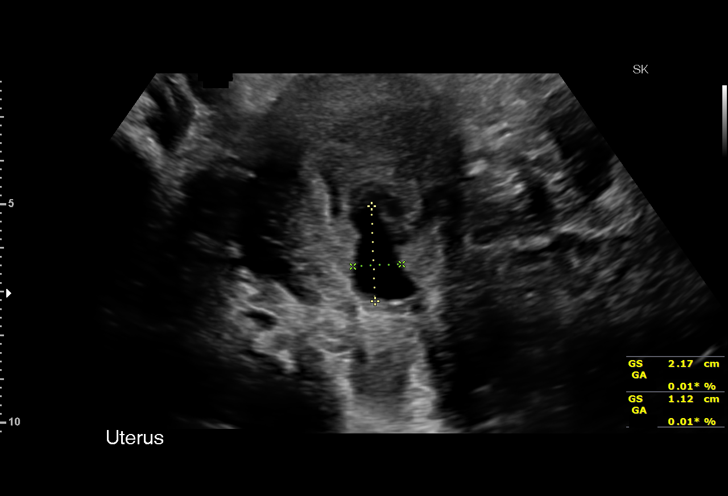
[im 14/27]
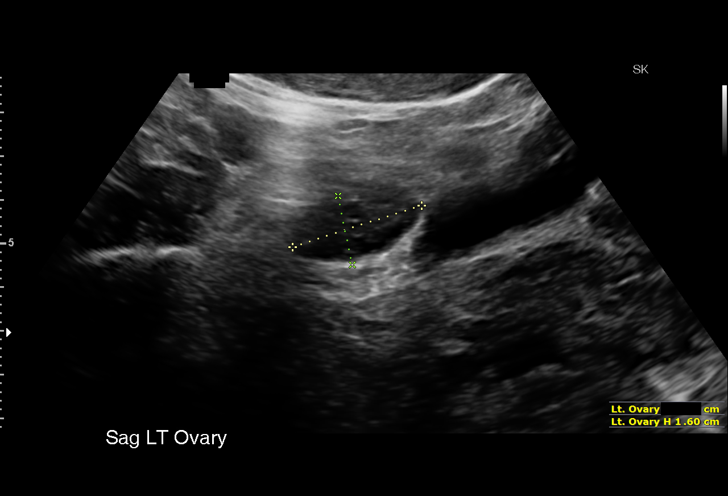
[im 16/27]
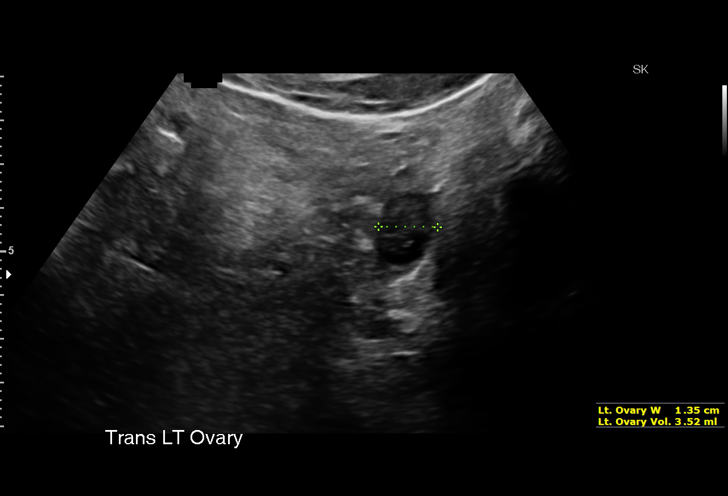
[im 18/27]
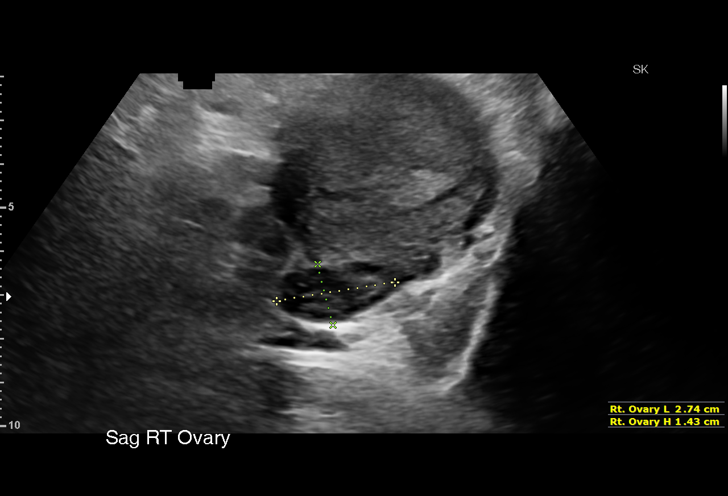
[im 19/27]
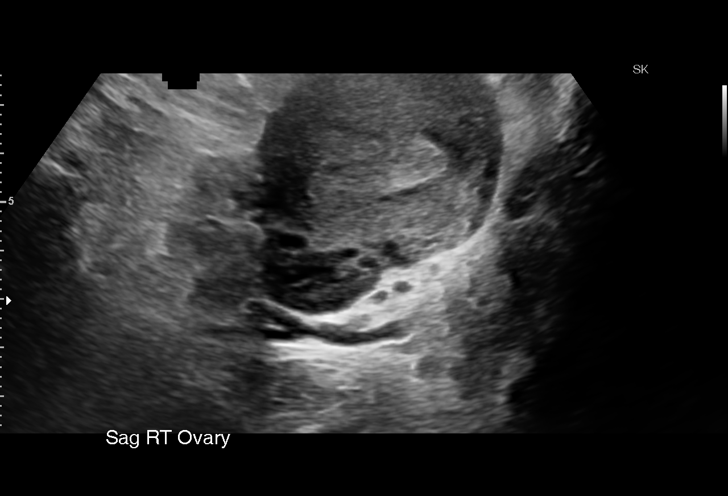
[im 21/27]
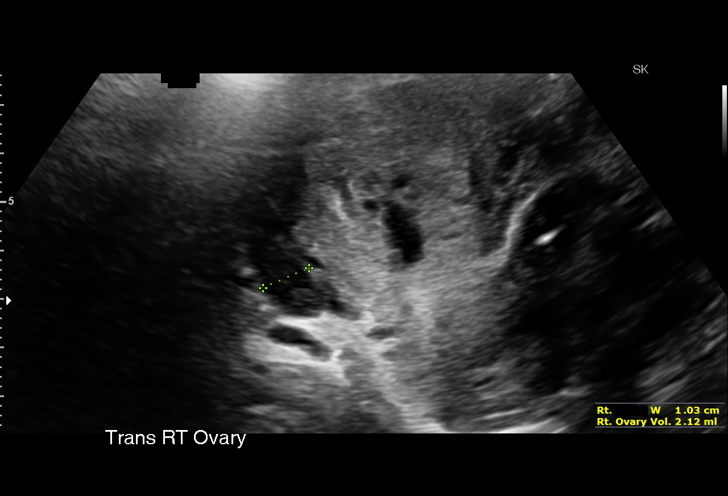
[im 23/27]
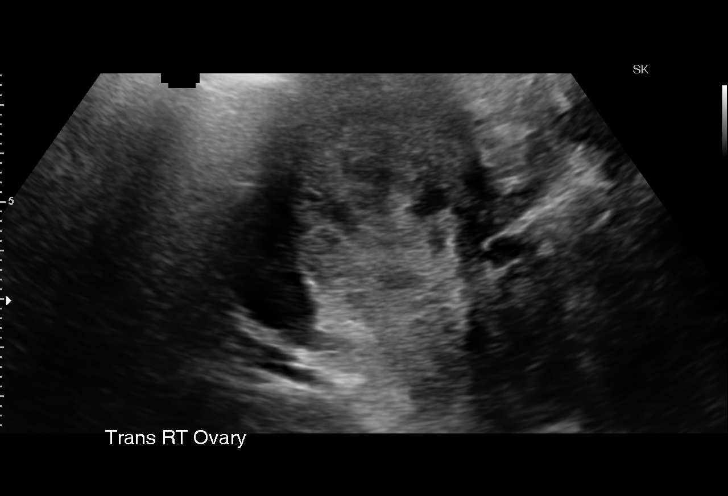
[im 25/27]
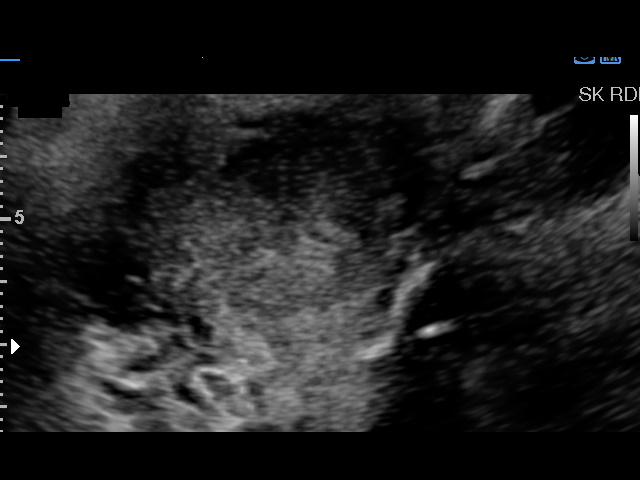
[im 27/27]
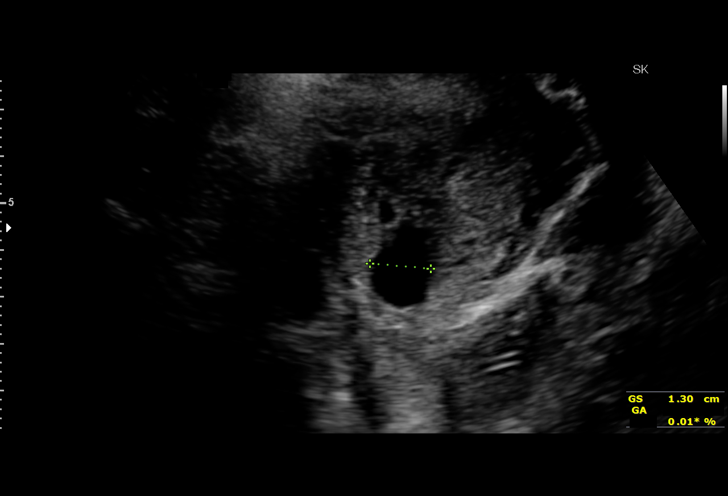

[15 of 27 positions shown; findings below may reference images not displayed]

FINDINGS: Intrauterine gestational sac: Probable intrauterine gestational sac.
Shape is slightly irregular.

Yolk sac:  Not present.

Embryo:  Not present.

MSD: 15.1  mm   6 w   2  d                US EDC: 06/30/2016

Subchorionic hemorrhage: None visualized on transabdominal
evaluation.

Maternal uterus/adnexae: Both ovaries are identified and are normal.
No pelvic free fluid is seen.
IMPRESSION: Probable early intrauterine gestational sac, but no yolk sac, fetal
pole, or cardiac activity yet visualized. Recommend follow-up
quantitative B-HCG levels and follow-up US in 14 days to confirm and
assess viability. This recommendation follows SRU consensus
guidelines: Diagnostic Criteria for Nonviable Pregnancy Early in the
First Trimester. N Engl J Med 7273; [DATE].

Please no patient declined transvaginal evaluation, partially
limiting assessment.
